# Patient Record
Sex: Female | Born: 2000
Health system: Southern US, Community
[De-identification: ages and names within clinical notes are randomized; demographics above are authoritative.]

## PROBLEM LIST (undated history)

## (undated) DIAGNOSIS — S060XAA Concussion with loss of consciousness status unknown, initial encounter: Secondary | ICD-10-CM

## (undated) DIAGNOSIS — E669 Obesity, unspecified: Secondary | ICD-10-CM

## (undated) DIAGNOSIS — S060X9A Concussion with loss of consciousness of unspecified duration, initial encounter: Secondary | ICD-10-CM

---
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
## 2019-01-12 DIAGNOSIS — R109 Unspecified abdominal pain: Secondary | ICD-10-CM | POA: Diagnosis not present

## 2019-02-04 ENCOUNTER — Other Ambulatory Visit: Payer: Self-pay

## 2019-02-04 MED ORDER — DROSPIRENONE-ETHINYL ESTRADIOL 3-0.02 MG PO TABS: 1.0000 | ORAL_TABLET | Freq: Every day | ORAL | 1 refills | Status: DC

## 2019-02-04 NOTE — Telephone Encounter (Signed)
Last OV 10/08/18 Last fill 12/19/18  #84/1

## 2019-02-17 DIAGNOSIS — Z03818 Encounter for observation for suspected exposure to other biological agents ruled out: Secondary | ICD-10-CM | POA: Diagnosis not present

## 2019-03-17 ENCOUNTER — Other Ambulatory Visit: Payer: Self-pay

## 2019-03-17 MED ORDER — SERTRALINE HCL 25 MG PO TABS: 25.0000 mg | ORAL_TABLET | Freq: Every day | ORAL | 0 refills | Status: DC

## 2019-03-17 NOTE — Telephone Encounter (Signed)
Last OV 10/08/18 Last fill 12/09/18  #90/0

## 2019-05-28 DIAGNOSIS — K011 Impacted teeth: Secondary | ICD-10-CM | POA: Diagnosis not present

## 2019-07-15 ENCOUNTER — Other Ambulatory Visit: Payer: Self-pay

## 2019-07-15 MED ORDER — DROSPIRENONE-ETHINYL ESTRADIOL 3-0.02 MG PO TABS: ORAL_TABLET | ORAL | 0 refills | Status: DC

## 2019-07-29 ENCOUNTER — Other Ambulatory Visit: Payer: Self-pay

## 2019-07-29 MED ORDER — SERTRALINE HCL 25 MG PO TABS: ORAL_TABLET | ORAL | 0 refills | Status: DC

## 2019-08-14 DIAGNOSIS — Z30012 Encounter for prescription of emergency contraception: Secondary | ICD-10-CM | POA: Diagnosis not present

## 2019-08-15 DIAGNOSIS — J020 Streptococcal pharyngitis: Secondary | ICD-10-CM | POA: Diagnosis not present

## 2019-10-20 ENCOUNTER — Other Ambulatory Visit: Payer: Self-pay | Admitting: Family Medicine

## 2019-10-22 NOTE — Telephone Encounter (Signed)
Medication refill request  Request declined. Patient must establish with a new PCP for future refills per last refill note.

## 2019-11-10 ENCOUNTER — Other Ambulatory Visit: Payer: Self-pay | Admitting: Family Medicine

## 2019-11-13 ENCOUNTER — Encounter: Payer: Self-pay | Admitting: Family Medicine

## 2019-11-13 ENCOUNTER — Telehealth (INDEPENDENT_AMBULATORY_CARE_PROVIDER_SITE_OTHER): Payer: Federal, State, Local not specified - PPO | Admitting: Family Medicine

## 2019-11-13 VITALS — Ht 70.0 in | Wt 220.0 lb

## 2019-11-13 DIAGNOSIS — Z3041 Encounter for surveillance of contraceptive pills: Secondary | ICD-10-CM

## 2019-11-13 MED ORDER — NORGESTIM-ETH ESTRAD TRIPHASIC 0.18/0.215/0.25 MG-25 MCG PO TABS: 1.0000 | ORAL_TABLET | Freq: Every day | ORAL | 3 refills | Status: DC

## 2019-11-13 NOTE — Progress Notes (Signed)
Virtual Visit via Video Note  I connected with Wanda Jarvis on 11/13/19 at 10:00 AM EDT by a video enabled telemedicine application and verified that I am speaking with the correct person using two identifiers. Location patient: home Location provider: work  Persons participating in the virtual visit: patient, provider  I discussed the limitations of evaluation and management by telemedicine and the availability of in person appointments. The patient expressed understanding and agreed to proceed.  Chief Complaint  Patient presents with  . Follow-up    BC refill     HPI: Wanda Jarvis is a 19 y.o. female who is seen today to discuss OCP. She has been on this x 5 years. She has been off of it x 1 week and feels like she has less mood swings. She would like to change to a different OCP.  Denies breakthrough bleeding. Denies vaginal itching, odor, discharge.    Past Medical History:  Diagnosis Date  . Concussion   . Obesity     Past Surgical History:  Procedure Laterality Date  . CHOLECYSTECTOMY  11/09/2015  . CHOLECYSTECTOMY N/A 11/09/2015   Procedure: LAPAROSCOPIC CHOLECYSTECTOMY;  Surgeon: Leonia Corona, MD;  Location: [facility] OR;  Service: Pediatrics;  Laterality: N/A;    Family History  Problem Relation Age of Onset  . Hypertension Maternal Grandmother   . Hyperlipidemia Maternal Grandmother   . Arthritis Maternal Grandmother   . Thyroid disease Maternal Grandmother   . Hypertension Maternal Grandfather   . Diabetes Maternal Grandfather   . Cancer Maternal Grandfather   . Hyperlipidemia Maternal Grandfather   . Arthritis Maternal Grandfather   . Thyroid disease Mother   . Diabetes Paternal Grandmother   . Arthritis Paternal Grandmother   . Arthritis Paternal Grandfather   . Heart disease Paternal Grandfather   . Arthritis Maternal Aunt   . Arthritis Maternal Uncle   . Arthritis Paternal Aunt   . Mental retardation Paternal Aunt   . Arthritis Paternal Uncle    . Deafness Other     Social History   Tobacco Use  . Smoking status: Passive Smoke Exposure - Never Smoker  . Smokeless tobacco: Never Used  . Tobacco comment: Mom smokes outside of home and car  Substance Use Topics  . Alcohol use: No  . Drug use: No     Current Outpatient Medications:  .  cetirizine (ZYRTEC) 10 MG tablet, Take 10 mg by mouth daily., Disp: , Rfl:  .  clindamycin (CLEOCIN T) 1 % external solution, APPLY TO AFFECTED AREA TWICE A DAY, Disp: 60 mL, Rfl: 0 .  drospirenone-ethinyl estradiol (NIKKI) 3-0.02 MG tablet, Take 1qd (Plz sched appt with new provider for future fills), Disp: 84 tablet, Rfl: 0 .  sertraline (ZOLOFT) 25 MG tablet, Take 1qd (Plz sched appt with new provider for future fills), Disp: 90 tablet, Rfl: 0  Allergies  Allergen Reactions  . No Known Allergies       ROS: See pertinent positives and negatives per HPI.   EXAM:  VITALS per patient if applicable: Ht 5\' 10"  (1.778 m)   Wt 220 lb (99.8 kg) Comment: pt reported  BMI 31.57 kg/m    GENERAL: alert, oriented, appears well and in no acute distress  HEENT: atraumatic, conjunctiva clear, no obvious abnormalities on inspection of external nose and ears  NECK: normal movements of the head and neck  LUNGS: on inspection no signs of respiratory distress, breathing rate appears normal, no obvious gross SOB, gasping or wheezing, no  conversational dyspnea  PSYCH/NEURO: pleasant and cooperative, no obvious depression or anxiety, speech and thought processing grossly intact   ASSESSMENT AND PLAN:  1. Encounter for birth control pills maintenance - d/c drospirenone-ethinyl estradiol Rx: - Norgestimate-Ethinyl Estradiol Triphasic 0.18/0.215/0.25 MG-25 MCG tab; Take 1 tablet by mouth daily.  Dispense: 84 tablet; Refill: 3 - f/u in 1 year for PAP and refill or sooner PRN Discussed plan and reviewed medications with patient, including risks, benefits, and potential side effects. Pt expressed  understand. All questions answered.    I discussed the assessment and treatment plan with the patient. The patient was provided an opportunity to ask questions and all were answered. The patient agreed with the plan and demonstrated an understanding of the instructions.   The patient was advised to call back or seek an in-person evaluation if the symptoms worsen or if the condition fails to improve as anticipated.   Wanda Shu, DO

## 2019-11-17 ENCOUNTER — Telehealth: Payer: Self-pay | Admitting: Family Medicine

## 2019-11-17 NOTE — Telephone Encounter (Signed)
Dr. Salena Saner please advise.  Pt was recently prescribed Norgestimate-Ethinyl Estradiol an hasn't started yet because she read bad reviews and would like to take something else. Pt knows you work Wednesday, Thursday and Friday and preferred you not doc of day.

## 2019-11-17 NOTE — Telephone Encounter (Signed)
Patient states Dr. Barron Alvine recently prescribed birth control pills, but upon reading reviews which talked about anxiety, depression and mood swings for side effects, she does not believe this medication is the right one for her. She has not started taking this medication yet and would like Dr. Barron Alvine to prescribe different birth control pills. Patient is aware Dr. Salena Saner. Is out of the office until Wednesday.

## 2019-11-19 DIAGNOSIS — F4322 Adjustment disorder with anxiety: Secondary | ICD-10-CM | POA: Diagnosis not present

## 2019-11-19 MED ORDER — LEVONORGEST-ETH ESTRAD 91-DAY 0.15-0.03 MG PO TABS: 1.0000 | ORAL_TABLET | Freq: Every day | ORAL | 4 refills | Status: DC

## 2019-11-19 NOTE — Telephone Encounter (Signed)
Dr. Salena Saner please advise.  Pt's mother was notified and she said that she would try the Seasonale out if we could [facility] for her daughter.

## 2019-11-19 NOTE — Telephone Encounter (Signed)
Pt is aware and will pick up Rx.

## 2019-11-19 NOTE — Telephone Encounter (Signed)
The possible side effects pt notes are possible with any hormonal OCP. We can try something like seasonale where pt would only get period every 3 mo (4x/year) as this has different hormonal components?

## 2019-11-19 NOTE — Telephone Encounter (Signed)
[facility] [place] [facility] previous [facility]CP Rx was sent

## 2019-11-25 DIAGNOSIS — M25562 Pain in left knee: Secondary | ICD-10-CM | POA: Diagnosis not present

## 2019-11-25 DIAGNOSIS — M79632 Pain in left forearm: Secondary | ICD-10-CM | POA: Diagnosis not present

## 2019-11-25 DIAGNOSIS — M545 Low back pain: Secondary | ICD-10-CM | POA: Diagnosis not present

## 2019-12-04 DIAGNOSIS — F4322 Adjustment disorder with anxiety: Secondary | ICD-10-CM | POA: Diagnosis not present

## 2019-12-25 DIAGNOSIS — F4322 Adjustment disorder with anxiety: Secondary | ICD-10-CM | POA: Diagnosis not present

## 2020-01-16 DIAGNOSIS — L089 Local infection of the skin and subcutaneous tissue, unspecified: Secondary | ICD-10-CM | POA: Diagnosis not present

## 2020-01-26 DIAGNOSIS — Z6830 Body mass index (BMI) 30.0-30.9, adult: Secondary | ICD-10-CM | POA: Diagnosis not present

## 2020-01-26 DIAGNOSIS — L723 Sebaceous cyst: Secondary | ICD-10-CM | POA: Diagnosis not present

## 2020-02-12 DIAGNOSIS — Z6833 Body mass index (BMI) 33.0-33.9, adult: Secondary | ICD-10-CM | POA: Diagnosis not present

## 2020-02-12 DIAGNOSIS — F330 Major depressive disorder, recurrent, mild: Secondary | ICD-10-CM | POA: Diagnosis not present

## 2020-02-12 DIAGNOSIS — F419 Anxiety disorder, unspecified: Secondary | ICD-10-CM | POA: Diagnosis not present

## 2020-02-12 DIAGNOSIS — K219 Gastro-esophageal reflux disease without esophagitis: Secondary | ICD-10-CM | POA: Diagnosis not present

## 2020-03-11 DIAGNOSIS — Z6832 Body mass index (BMI) 32.0-32.9, adult: Secondary | ICD-10-CM | POA: Diagnosis not present

## 2020-03-11 DIAGNOSIS — F419 Anxiety disorder, unspecified: Secondary | ICD-10-CM | POA: Diagnosis not present

## 2020-04-21 DIAGNOSIS — R202 Paresthesia of skin: Secondary | ICD-10-CM | POA: Diagnosis not present

## 2020-04-21 DIAGNOSIS — M25572 Pain in left ankle and joints of left foot: Secondary | ICD-10-CM | POA: Diagnosis not present

## 2020-04-21 DIAGNOSIS — M79672 Pain in left foot: Secondary | ICD-10-CM | POA: Diagnosis not present

## 2020-04-21 DIAGNOSIS — Z6832 Body mass index (BMI) 32.0-32.9, adult: Secondary | ICD-10-CM | POA: Diagnosis not present

## 2020-04-28 DIAGNOSIS — F419 Anxiety disorder, unspecified: Secondary | ICD-10-CM | POA: Diagnosis not present

## 2020-04-28 DIAGNOSIS — Z6834 Body mass index (BMI) 34.0-34.9, adult: Secondary | ICD-10-CM | POA: Diagnosis not present

## 2020-04-28 DIAGNOSIS — F330 Major depressive disorder, recurrent, mild: Secondary | ICD-10-CM | POA: Diagnosis not present

## 2020-05-02 ENCOUNTER — Other Ambulatory Visit: Payer: Self-pay | Admitting: Family Medicine

## 2020-05-03 NOTE — Telephone Encounter (Signed)
Name from pharmacy: SERTRALINE HCL 25 MG TABLET        Will file in chart as: sertraline (ZOLOFT) 25 MG tablet   Sig: TAKE 1 TABLET EVERY DAY ( SCHEDULE APPT WITH NEW PROVIDER FOR FUTURE FILLS)   Last fill 07/29/19  #90/0 Last VV 11/13/19

## 2020-05-05 DIAGNOSIS — M25572 Pain in left ankle and joints of left foot: Secondary | ICD-10-CM | POA: Diagnosis not present

## 2020-06-01 DIAGNOSIS — R61 Generalized hyperhidrosis: Secondary | ICD-10-CM | POA: Diagnosis not present

## 2020-06-01 DIAGNOSIS — Z681 Body mass index (BMI) 19 or less, adult: Secondary | ICD-10-CM | POA: Diagnosis not present

## 2020-06-01 DIAGNOSIS — F330 Major depressive disorder, recurrent, mild: Secondary | ICD-10-CM | POA: Diagnosis not present

## 2020-06-01 DIAGNOSIS — F419 Anxiety disorder, unspecified: Secondary | ICD-10-CM | POA: Diagnosis not present

## 2020-06-14 ENCOUNTER — Other Ambulatory Visit: Payer: Self-pay | Admitting: Family Medicine

## 2020-07-08 ENCOUNTER — Other Ambulatory Visit: Payer: Self-pay | Admitting: Family Medicine

## 2020-07-22 ENCOUNTER — Other Ambulatory Visit: Payer: Self-pay | Admitting: Family Medicine

## 2020-08-09 DIAGNOSIS — R350 Frequency of micturition: Secondary | ICD-10-CM | POA: Diagnosis not present

## 2020-08-09 DIAGNOSIS — N3000 Acute cystitis without hematuria: Secondary | ICD-10-CM | POA: Diagnosis not present

## 2020-08-09 DIAGNOSIS — R300 Dysuria: Secondary | ICD-10-CM | POA: Diagnosis not present

## 2020-08-18 DIAGNOSIS — R300 Dysuria: Secondary | ICD-10-CM | POA: Diagnosis not present

## 2020-08-18 DIAGNOSIS — R350 Frequency of micturition: Secondary | ICD-10-CM | POA: Diagnosis not present

## 2020-08-18 DIAGNOSIS — Z6834 Body mass index (BMI) 34.0-34.9, adult: Secondary | ICD-10-CM | POA: Diagnosis not present

## 2020-08-19 DIAGNOSIS — B9689 Other specified bacterial agents as the cause of diseases classified elsewhere: Secondary | ICD-10-CM | POA: Diagnosis not present

## 2020-08-19 DIAGNOSIS — R109 Unspecified abdominal pain: Secondary | ICD-10-CM | POA: Diagnosis not present

## 2020-08-19 DIAGNOSIS — Z9049 Acquired absence of other specified parts of digestive tract: Secondary | ICD-10-CM | POA: Diagnosis not present

## 2020-08-19 DIAGNOSIS — N390 Urinary tract infection, site not specified: Secondary | ICD-10-CM | POA: Diagnosis not present

## 2020-08-19 DIAGNOSIS — R350 Frequency of micturition: Secondary | ICD-10-CM | POA: Diagnosis not present

## 2020-08-19 DIAGNOSIS — F17200 Nicotine dependence, unspecified, uncomplicated: Secondary | ICD-10-CM | POA: Diagnosis not present

## 2020-08-19 DIAGNOSIS — K760 Fatty (change of) liver, not elsewhere classified: Secondary | ICD-10-CM | POA: Diagnosis not present

## 2020-08-19 DIAGNOSIS — Z87440 Personal history of urinary (tract) infections: Secondary | ICD-10-CM | POA: Diagnosis not present

## 2020-11-12 DIAGNOSIS — F411 Generalized anxiety disorder: Secondary | ICD-10-CM | POA: Diagnosis not present

## 2020-11-15 DIAGNOSIS — F411 Generalized anxiety disorder: Secondary | ICD-10-CM | POA: Diagnosis not present

## 2020-11-24 DIAGNOSIS — F411 Generalized anxiety disorder: Secondary | ICD-10-CM | POA: Diagnosis not present

## 2020-12-01 DIAGNOSIS — F411 Generalized anxiety disorder: Secondary | ICD-10-CM | POA: Diagnosis not present

## 2020-12-22 DIAGNOSIS — F411 Generalized anxiety disorder: Secondary | ICD-10-CM | POA: Diagnosis not present

## 2021-01-06 DIAGNOSIS — F411 Generalized anxiety disorder: Secondary | ICD-10-CM | POA: Diagnosis not present

## 2021-01-08 ENCOUNTER — Telehealth: Payer: Self-pay | Admitting: Family Medicine

## 2021-01-08 MED ORDER — LEVONORGEST-ETH ESTRAD 91-DAY 0.15-0.03 MG PO TABS: 1.0000 | ORAL_TABLET | Freq: Every day | ORAL | 0 refills | Status: DC

## 2021-01-08 NOTE — Telephone Encounter (Addendum)
Received message from mom that patient needs OCP refill. Last saw Dr Barron Alvine 10/2019.  Will send in 3 month supply, pt to schedule transfer care OV with new provider in interim, or f/u with [facility] for more refills.   Will send to front office to assist in scheduling transfer of care appointment, may do at [facility] or with Tabitha at [facility], pt preference. Schedule in office if able, otherwise could do virtual visit if she's staying in [place].

## 2021-01-10 NOTE — Telephone Encounter (Signed)
Lvm for pt to get scheduled with TOC with our news proviver Wyatt Mage

## 2021-01-11 NOTE — Telephone Encounter (Signed)
Pt scheduled a TOC with Tabitha on 12.1.22

## 2021-01-13 DIAGNOSIS — F411 Generalized anxiety disorder: Secondary | ICD-10-CM | POA: Diagnosis not present

## 2021-01-27 ENCOUNTER — Ambulatory Visit: Payer: Federal, State, Local not specified - PPO | Admitting: Family

## 2021-01-27 DIAGNOSIS — F411 Generalized anxiety disorder: Secondary | ICD-10-CM | POA: Diagnosis not present

## 2021-02-17 ENCOUNTER — Ambulatory Visit: Payer: Federal, State, Local not specified - PPO | Admitting: Family

## 2021-02-24 ENCOUNTER — Encounter: Payer: Self-pay | Admitting: Family

## 2021-02-24 ENCOUNTER — Telehealth (INDEPENDENT_AMBULATORY_CARE_PROVIDER_SITE_OTHER): Payer: Federal, State, Local not specified - PPO | Admitting: Family

## 2021-02-24 ENCOUNTER — Other Ambulatory Visit: Payer: Self-pay

## 2021-02-24 VITALS — Ht 70.0 in | Wt 240.0 lb

## 2021-02-24 DIAGNOSIS — E049 Nontoxic goiter, unspecified: Secondary | ICD-10-CM

## 2021-02-24 DIAGNOSIS — R635 Abnormal weight gain: Secondary | ICD-10-CM | POA: Diagnosis not present

## 2021-02-24 DIAGNOSIS — F419 Anxiety disorder, unspecified: Secondary | ICD-10-CM | POA: Diagnosis not present

## 2021-02-24 DIAGNOSIS — R61 Generalized hyperhidrosis: Secondary | ICD-10-CM

## 2021-02-24 DIAGNOSIS — Z3040 Encounter for surveillance of contraceptives, unspecified: Secondary | ICD-10-CM | POA: Insufficient documentation

## 2021-02-24 DIAGNOSIS — Z9109 Other allergy status, other than to drugs and biological substances: Secondary | ICD-10-CM

## 2021-02-24 DIAGNOSIS — N920 Excessive and frequent menstruation with regular cycle: Secondary | ICD-10-CM

## 2021-02-24 DIAGNOSIS — F32A Depression, unspecified: Secondary | ICD-10-CM

## 2021-02-24 MED ORDER — ESCITALOPRAM OXALATE 10 MG PO TABS: 10.0000 mg | ORAL_TABLET | Freq: Every day | ORAL | 2 refills | Status: DC

## 2021-02-24 MED ORDER — LEVONORGEST-ETH ESTRAD 91-DAY 0.15-0.03 MG PO TABS: 1.0000 | ORAL_TABLET | Freq: Every day | ORAL | 0 refills | Status: DC

## 2021-02-24 NOTE — Assessment & Plan Note (Signed)
lexapro 10 mg refill sent to pharmacy. discussed rare but serious side effect of suicidal ideation.  She is instructed to discontinue medication and go directly to ED if this occurs.  Pt verbalizes understanding.  Plan is to follow up every six months to monitor. phq9 and gad7 reviewed.

## 2021-02-24 NOTE — Assessment & Plan Note (Signed)
Will assess for TSH, pending results. Work on diet and exercises.

## 2021-02-24 NOTE — Assessment & Plan Note (Signed)
Controlled at present per pt.

## 2021-02-24 NOTE — Assessment & Plan Note (Signed)
tsh ordered, pending results.

## 2021-02-24 NOTE — Assessment & Plan Note (Signed)
Work on diet and exercise. Weight gain workup TSH ordered, pending results.

## 2021-02-24 NOTE — Assessment & Plan Note (Addendum)
Stable, controlled with birth control. Sent refill for OCPs

## 2021-02-24 NOTE — Progress Notes (Signed)
MyChart Video Visit    Virtual Visit via Video Note   This visit type was conducted due to national recommendations for restrictions regarding the COVID-19 Pandemic (e.g. social distancing) in an effort to limit this patient's exposure and mitigate transmission in our community. This patient is at least at moderate risk for complications without adequate follow up. This format is felt to be most appropriate for this patient at this time. Physical exam was limited by quality of the video and audio technology used for the visit. CMA was able to get the patient set up on a video visit.  Patient location: Home. Patient and provider in visit Provider location: Office  I discussed the limitations of evaluation and management by telemedicine and the availability of in person appointments. The patient expressed understanding and agreed to proceed.  Visit Date: 02/24/2021  Today's healthcare provider: Mort Sawyers, FNP   Subjective:  Patient ID: Wanda Jarvis, female    DOB: Feb 12, 2001  Age: 20 y.o. MRN: [id number]  CC:  Chief Complaint  Patient presents with   Transitions Of Care    HPI Wanda Jarvis is here today for a transition of care visit as well as for refills on her OCP as well as lexapro.  Patient was previously seeing Dr. Clifton Custard.  Pt is without acute concerns.   Pt has been on birth control (seasonale), pt does well without side effects with this. She did run out of birth control. She still is taking tablets, but she is about to run out. LMP 02/15/21, which was out of the ordinary but she did get a bit off cycle bc she had been running out and had skipped a few days. Had sex over a month ago, took a pregnancy test, and was negative. Has had a period since. Uses condoms.   In the last six months has had two different sexual female partners.   Anxiety/depression: mainly anxiety, but lexapro 10 mg is doing really well on. Had tried zoloft prior but it didn't seem to 'work  with her body'. She had been on prozac as well, but didn't like it at all either. No SI or HI.   PHQ9 SCORE ONLY 02/24/2021 02/24/2021 11/13/2019  PHQ-9 Total Score 2 0 0   GAD 7 : Generalized Anxiety Score 02/24/2021 10/08/2018 09/19/2018  Nervous, Anxious, on Edge 0 0 0  Control/stop worrying 2 1 0  Worry too much - different things 1 1 0  Trouble relaxing 1 0 0  Restless 0 0 0  Easily annoyed or irritable 1 0 1  Afraid - awful might happen 0 0 0  Total GAD 7 Score Anxiety Difficulty - Not difficult at all Not difficult at all      Chronic problems addressed today:  HTN: from readings prior, htn is not an accurate dx and has resolved if it was at one time.   Hyperhydrosis: worse in high school , now more tolerable.   Wt Readings from Last 3 Encounters:  02/24/21 240 lb (108.9 kg)  11/13/19 220 lb (99.8 kg)  10/08/18 205 lb (93 kg) (98 %, Z= 2.02)*   * Growth percentiles are based on CDC (Girls, 2-20 Years) data.   Temp Readings from Last 3 Encounters:  12/10/17 98.8 F (37.1 C) (Oral)  07/24/17 98.1 F (36.7 C) (Oral)  07/19/17 98.2 F (36.8 C) (Oral)   BP Readings from Last 3 Encounters:  12/10/17 104/68  10/10/17 110/80  08/22/17 110/68 (40 %,  Z = -0.25 /  55 %, Z = 0.13)*   *BP percentiles are based on the 2017 AAP Clinical Practice Guideline for girls   Pulse Readings from Last 3 Encounters:  12/10/17 72  10/10/17 75  08/22/17 64     Past Medical History:  Diagnosis Date   Concussion    Hypermobility syndrome 05/18/2017   Obesity     Past Surgical History:  Procedure Laterality Date   CHOLECYSTECTOMY  11/09/2015   CHOLECYSTECTOMY N/A 11/09/2015   Procedure: LAPAROSCOPIC CHOLECYSTECTOMY;  Surgeon: Leonia Corona, MD;  Location: [facility] OR;  Service: Pediatrics;  Laterality: N/A;    Family History  Problem Relation Age of Onset   Hypertension Maternal Grandmother    Hyperlipidemia Maternal Grandmother    Arthritis Maternal Grandmother     Thyroid disease Maternal Grandmother    Hypertension Maternal Grandfather    Diabetes Maternal Grandfather    Cancer Maternal Grandfather    Hyperlipidemia Maternal Grandfather    Arthritis Maternal Grandfather    Thyroid disease Mother    Diabetes Paternal Grandmother    Arthritis Paternal Grandmother    Arthritis Paternal Grandfather    Heart disease Paternal Grandfather    Arthritis Maternal Aunt    Arthritis Maternal Uncle    Arthritis Paternal Aunt    Mental retardation Paternal Aunt    Arthritis Paternal Uncle    Deafness Other     Social History   Socioeconomic History   Marital status: Single    Spouse name: Not on file   Number of children: Not on file   Years of education: Not on file   Highest education level: Not on file  Occupational History   Occupation: student    Comment: [employer]  Tobacco Use   Smoking status: Never    Passive exposure: Yes   Smokeless tobacco: Never   Tobacco comments:    Mom smokes outside of home and car  Vaping Use   Vaping Use: Every day   Start date: 01/11/2017   Substances: Nicotine  Substance and Sexual Activity   Alcohol use: Yes    Alcohol/week: 6.0 standard drinks    Types: 6 Standard drinks or equivalent per week    Comment: a few times a month   Drug use: No   Sexual activity: Yes    Partners: Male    Birth control/protection: Condom, OCP  Other Topics Concern   Not on file  Social History Narrative   Only child, 1 dog in the home      Parents Healthy      Parents married      Mom works at [employer]       Dad is supvr @ [employer] (consulting)      Attends [employer] 11th grade.   Social Determinants of Health   Financial Resource Strain: Not on file  Food Insecurity: Not on file  Transportation Needs: Not on file  Physical Activity: Not on file  Stress: Not on file  Social Connections: Not on file  Intimate Partner Violence: Not on file    Outpatient Medications Prior to Visit  Medication Sig  Dispense Refill   cetirizine (ZYRTEC) 10 MG tablet Take 10 mg by mouth daily.     clindamycin (CLEOCIN T) 1 % external solution APPLY TO AFFECTED AREA TWICE A DAY 60 mL 0   escitalopram (LEXAPRO) 10 MG tablet Take 1 tablet by mouth daily.     levonorgestrel-ethinyl estradiol (SEASONALE) 0.15-0.03 MG tablet Take 1 tablet by  mouth daily. 91 tablet 0   sertraline (ZOLOFT) 25 MG tablet TAKE 1 TABLET EVERY DAY ( SCHEDULE APPT WITH NEW PROVIDER FOR FUTURE FILLS) 30 tablet 1   No facility-administered medications prior to visit.    No Known Allergies  ROS Review of Systems  Constitutional:  Positive for unexpected weight change (some weight gain, however thinks due to diet).   Review of Systems  Respiratory:  Negative for shortness of breath.   Cardiovascular:  Negative for chest pain and palpitations.  Gastrointestinal:  Negative for constipation and diarrhea.  Genitourinary:  Negative for dysuria, frequency and urgency.  Musculoskeletal:  Negative for myalgias.  Psychiatric/Behavioral:  Negative for depression and suicidal ideas.   All other systems reviewed and are negative.    Objective:    Physical Exam Constitutional:      General: She is not in acute distress.    Appearance: Normal appearance. She is obese. She is not ill-appearing, toxic-appearing or diaphoretic.  HENT:     Head: Normocephalic.  Pulmonary:     Effort: Pulmonary effort is normal.  Neurological:     General: No focal deficit present.     Mental Status: She is alert and oriented to person, place, and time.  Psychiatric:        Mood and Affect: Mood normal.        Behavior: Behavior normal.        Thought Content: Thought content normal.        Judgment: Judgment normal.      Ht 5\' 10"  (1.778 m)    Wt 240 lb (108.9 kg)    LMP 02/14/2021 (Exact Date)    BMI 34.44 kg/m  Wt Readings from Last 3 Encounters:  02/24/21 240 lb (108.9 kg)  11/13/19 220 lb (99.8 kg)  10/08/18 205 lb (93 kg) (98 %, Z= 2.02)*    * Growth percentiles are based on CDC (Girls, 2-20 Years) data.     Health Maintenance Due  Topic Date Due   PAP-Cervical Cytology Screening  Never done   TETANUS/TDAP  10/06/2020    There are no preventive care reminders to display for this patient.  Lab Results  Component Value Date   TSH 1.33 07/21/2016   Lab Results  Component Value Date   WBC 5.4 11/09/2015   HGB 15.3 11/09/2015   HCT 44.3 11/09/2015   MCV 89.1 11/09/2015   PLT 200 11/09/2015   Lab Results  Component Value Date   NA 137 11/06/2012   K 4.1 11/06/2012   CO2 27 11/06/2012   GLUCOSE 107 (H) 11/06/2012   BUN 12 11/06/2012   CREATININE 0.60 01/24/2013   BILITOT 0.7 11/06/2012   ALKPHOS 131 (H) 11/06/2012   AST 19 11/06/2012   ALT 8 11/06/2012   PROT 7.5 11/06/2012   ALBUMIN 4.1 11/06/2012   CALCIUM 9.5 11/06/2012   GFR 147.57 11/06/2012   No results found for: CHOL No results found for: HDL No results found for: LDLCALC No results found for: TRIG No results found for: CHOLHDL Lab Results  Component Value Date   HGBA1C 5.0 07/28/2015      Assessment & Plan:   Problem List Items Addressed This Visit       Endocrine   Goiter    tsh ordered, pending results.      Relevant Orders   TSH     Musculoskeletal and Integument   Hyperhidrosis    Controlled at present per pt.  Other   Environmental allergies    Continue with zyrtec as needed.      Morbid obesity (HCC)    Work on diet and exercise. Weight gain workup TSH ordered, pending results.      Menorrhagia - Primary    Stable, controlled with birth control. Sent refill for OCPs      Relevant Medications   escitalopram (LEXAPRO) 10 MG tablet   Anxiety and depression    lexapro 10 mg refill sent to pharmacy. discussed rare but serious side effect of suicidal ideation.  She is instructed to discontinue medication and go directly to ED if this occurs.  Pt verbalizes understanding.  Plan is to follow up every six  months to monitor. phq9 and gad7 reviewed.       Relevant Medications   escitalopram (LEXAPRO) 10 MG tablet   Weight gain    Will assess for TSH, pending results. Work on diet and exercises.       Relevant Orders   TSH   Encounter for refill of prescription for contraception    hcg ordered, pending results. Refill sent to pharmacy for OCPs. Counseled on safe sex. 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    Dispense:  90 tablet    Refill:  2    Order Specific Question:   Supervising Provider    Answer:   BEDSOLE, AMY E [[id number]]   levonorgestrel-ethinyl estradiol (SEASONALE) 0.15-0.03 MG tablet    Sig: Take 1 tablet by mouth daily.    Dispense:  91 tablet    Refill:  0    Order Specific Question:   Supervising Provider    Answer:   Ermalene Searing, AMY E [[id number]]    Follow-up: Return in about 6 months (around 08/25/2021) for PAP well woman visit.   I provided 20 minutes of face-to-face time during this encounter.  Mort Sawyers, FNP

## 2021-02-24 NOTE — Assessment & Plan Note (Signed)
hcg ordered, pending results. Refill sent to pharmacy for OCPs. Counseled on safe sex. 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

## 2021-02-24 NOTE — Assessment & Plan Note (Signed)
Continue with zyrtec as needed.

## 2021-05-26 DIAGNOSIS — R350 Frequency of micturition: Secondary | ICD-10-CM | POA: Diagnosis not present

## 2021-05-26 DIAGNOSIS — N3001 Acute cystitis with hematuria: Secondary | ICD-10-CM | POA: Diagnosis not present

## 2021-05-26 DIAGNOSIS — Z708 Other sex counseling: Secondary | ICD-10-CM | POA: Diagnosis not present

## 2021-07-18 ENCOUNTER — Other Ambulatory Visit: Payer: Self-pay | Admitting: Family

## 2021-07-18 DIAGNOSIS — Z3040 Encounter for surveillance of contraceptives, unspecified: Secondary | ICD-10-CM

## 2021-10-15 ENCOUNTER — Other Ambulatory Visit: Payer: Self-pay | Admitting: Family

## 2021-10-15 DIAGNOSIS — Z3040 Encounter for surveillance of contraceptives, unspecified: Secondary | ICD-10-CM

## 2021-10-26 DIAGNOSIS — Z681 Body mass index (BMI) 19 or less, adult: Secondary | ICD-10-CM | POA: Diagnosis not present

## 2021-10-26 DIAGNOSIS — J038 Acute tonsillitis due to other specified organisms: Secondary | ICD-10-CM | POA: Diagnosis not present

## 2021-10-26 DIAGNOSIS — B9689 Other specified bacterial agents as the cause of diseases classified elsewhere: Secondary | ICD-10-CM | POA: Diagnosis not present

## 2021-10-26 DIAGNOSIS — J029 Acute pharyngitis, unspecified: Secondary | ICD-10-CM | POA: Diagnosis not present

## 2021-12-22 DIAGNOSIS — J029 Acute pharyngitis, unspecified: Secondary | ICD-10-CM | POA: Diagnosis not present

## 2021-12-22 DIAGNOSIS — J039 Acute tonsillitis, unspecified: Secondary | ICD-10-CM | POA: Diagnosis not present

## 2021-12-27 DIAGNOSIS — F330 Major depressive disorder, recurrent, mild: Secondary | ICD-10-CM | POA: Diagnosis not present

## 2021-12-27 DIAGNOSIS — Z113 Encounter for screening for infections with a predominantly sexual mode of transmission: Secondary | ICD-10-CM | POA: Diagnosis not present

## 2021-12-27 DIAGNOSIS — Z118 Encounter for screening for other infectious and parasitic diseases: Secondary | ICD-10-CM | POA: Diagnosis not present

## 2021-12-27 DIAGNOSIS — R5381 Other malaise: Secondary | ICD-10-CM | POA: Diagnosis not present

## 2021-12-27 DIAGNOSIS — R5383 Other fatigue: Secondary | ICD-10-CM | POA: Diagnosis not present

## 2021-12-27 DIAGNOSIS — F419 Anxiety disorder, unspecified: Secondary | ICD-10-CM | POA: Diagnosis not present

## 2022-01-05 DIAGNOSIS — R5381 Other malaise: Secondary | ICD-10-CM | POA: Diagnosis not present

## 2022-01-05 DIAGNOSIS — R768 Other specified abnormal immunological findings in serum: Secondary | ICD-10-CM | POA: Diagnosis not present

## 2022-01-05 DIAGNOSIS — B009 Herpesviral infection, unspecified: Secondary | ICD-10-CM | POA: Diagnosis not present

## 2022-01-10 DIAGNOSIS — R5381 Other malaise: Secondary | ICD-10-CM | POA: Diagnosis not present

## 2022-01-13 ENCOUNTER — Other Ambulatory Visit: Payer: Self-pay | Admitting: Family

## 2022-01-13 DIAGNOSIS — Z3040 Encounter for surveillance of contraceptives, unspecified: Secondary | ICD-10-CM

## 2022-01-18 ENCOUNTER — Telehealth: Payer: Self-pay | Admitting: Family

## 2022-01-18 DIAGNOSIS — Z3040 Encounter for surveillance of contraceptives, unspecified: Secondary | ICD-10-CM

## 2022-01-18 NOTE — Telephone Encounter (Signed)
Pt mother called in stated pt needs a refill of birth control medication . Did not know the name of Medication stated pt is away at school and could not make a appointment with PCP until Dec. Please advise # [phone]

## 2022-01-22 MED ORDER — LEVONORGEST-ETH ESTRAD 91-DAY 0.15-0.03 MG PO TABS: 1.0000 | ORAL_TABLET | Freq: Every day | ORAL | 0 refills | Status: DC

## 2022-03-08 DIAGNOSIS — Z03818 Encounter for observation for suspected exposure to other biological agents ruled out: Secondary | ICD-10-CM | POA: Diagnosis not present

## 2022-03-08 DIAGNOSIS — J111 Influenza due to unidentified influenza virus with other respiratory manifestations: Secondary | ICD-10-CM | POA: Diagnosis not present

## 2022-03-08 DIAGNOSIS — Z20822 Contact with and (suspected) exposure to covid-19: Secondary | ICD-10-CM | POA: Diagnosis not present

## 2022-03-08 DIAGNOSIS — M791 Myalgia, unspecified site: Secondary | ICD-10-CM | POA: Diagnosis not present

## 2022-03-08 DIAGNOSIS — R509 Fever, unspecified: Secondary | ICD-10-CM | POA: Diagnosis not present

## 2022-04-15 ENCOUNTER — Other Ambulatory Visit: Payer: Self-pay | Admitting: Family

## 2022-04-15 DIAGNOSIS — Z3040 Encounter for surveillance of contraceptives, unspecified: Secondary | ICD-10-CM

## 2022-04-20 ENCOUNTER — Other Ambulatory Visit: Payer: Self-pay | Admitting: Family

## 2022-04-20 DIAGNOSIS — N920 Excessive and frequent menstruation with regular cycle: Secondary | ICD-10-CM

## 2022-04-21 ENCOUNTER — Telehealth: Payer: Self-pay | Admitting: Family

## 2022-04-21 DIAGNOSIS — Z3040 Encounter for surveillance of contraceptives, unspecified: Secondary | ICD-10-CM

## 2022-04-21 MED ORDER — LEVONORGEST-ETH ESTRAD 91-DAY 0.15-0.03 MG PO TABS: 1.0000 | ORAL_TABLET | Freq: Every day | ORAL | 0 refills | Status: DC

## 2022-04-21 NOTE — Telephone Encounter (Signed)
Patient has scheduled an appt for next month. Only 30 day supply sent to cover that time.

## 2022-04-21 NOTE — Telephone Encounter (Signed)
Spoke with Mom(DPR) and advised that patient would need to schedule a CPE first before we can send in medication. We will send in one month supply after appt is scheduled.

## 2022-04-21 NOTE — Addendum Note (Signed)
Addended by: Vaughan Browner on: 04/21/2022 11:57 AM   Modules accepted: Orders

## 2022-04-21 NOTE — Telephone Encounter (Signed)
Prescription Request  04/21/2022  Is this a "Controlled Substance" medicine? No  LOV: Visit date not found  What is the name of the medication or equipment? levonorgestrel-ethinyl estradiol ([place]) 0.15-0.03 MG tablet   Have you contacted your pharmacy to request a refill? No   Which pharmacy would you like this sent to?   [id number] [place] [facility] [id number]: [phone]: [phone]   Patient notified that their request is being sent to the clinical staff for review and that they should receive a response within 2 business days.   Please advise at Mobile [phone](mobile)

## 2022-05-16 ENCOUNTER — Ambulatory Visit (INDEPENDENT_AMBULATORY_CARE_PROVIDER_SITE_OTHER): Payer: Federal, State, Local not specified - PPO | Admitting: Family

## 2022-05-16 ENCOUNTER — Encounter: Payer: Self-pay | Admitting: Family

## 2022-05-16 VITALS — BP 120/66 | HR 72 | Temp 97.9°F | Ht 70.0 in | Wt 236.4 lb

## 2022-05-16 DIAGNOSIS — F32A Depression, unspecified: Secondary | ICD-10-CM

## 2022-05-16 DIAGNOSIS — N920 Excessive and frequent menstruation with regular cycle: Secondary | ICD-10-CM | POA: Diagnosis not present

## 2022-05-16 DIAGNOSIS — Z13220 Encounter for screening for lipoid disorders: Secondary | ICD-10-CM

## 2022-05-16 DIAGNOSIS — Z3040 Encounter for surveillance of contraceptives, unspecified: Secondary | ICD-10-CM

## 2022-05-16 DIAGNOSIS — Z23 Encounter for immunization: Secondary | ICD-10-CM | POA: Diagnosis not present

## 2022-05-16 DIAGNOSIS — Z0000 Encounter for general adult medical examination without abnormal findings: Secondary | ICD-10-CM

## 2022-05-16 DIAGNOSIS — F419 Anxiety disorder, unspecified: Secondary | ICD-10-CM

## 2022-05-16 LAB — LIPID PANEL
Cholesterol: 197 mg/dL (ref 0–200)
HDL: 71.3 mg/dL (ref >39.00)
LDL Cholesterol: 102 mg/dL — ABNORMAL HIGH (ref 0–99)
NonHDL: 125.67
Total CHOL/HDL Ratio: 3
Triglycerides: 116 mg/dL (ref 0.0–149.0)
VLDL: 23.2 mg/dL (ref 0.0–40.0)

## 2022-05-16 LAB — BASIC METABOLIC PANEL
BUN: 13 mg/dL (ref 6–23)
CO2: 23 mEq/L (ref 19–32)
Calcium: 9.3 mg/dL (ref 8.4–10.5)
Chloride: 105 mEq/L (ref 96–112)
Creatinine, Ser: 0.78 mg/dL (ref 0.40–1.20)
GFR: 107.65 mL/min (ref >60.00)
Glucose, Bld: 89 mg/dL (ref 70–99)
Potassium: 4 mEq/L (ref 3.5–5.1)
Sodium: 137 mEq/L (ref 135–145)

## 2022-05-16 LAB — CBC
HCT: 40.9 % (ref 36.0–46.0)
Hemoglobin: 14 g/dL (ref 12.0–15.0)
MCHC: 34.4 g/dL (ref 30.0–36.0)
MCV: 90.8 fl (ref 78.0–100.0)
Platelets: 275 10*3/uL (ref 150.0–400.0)
RBC: 4.5 Mil/uL (ref 3.87–5.11)
RDW: 13.7 % (ref 11.5–15.5)
WBC: 5.9 10*3/uL (ref 4.0–10.5)

## 2022-05-16 LAB — HCG, QUANTITATIVE, PREGNANCY: Quantitative HCG: <0.6 m[IU]/mL

## 2022-05-16 MED ORDER — ESCITALOPRAM OXALATE 10 MG PO TABS: 10.0000 mg | ORAL_TABLET | Freq: Every day | ORAL | 3 refills | Status: DC

## 2022-05-16 MED ORDER — LEVONORGEST-ETH ESTRAD 91-DAY 0.15-0.03 MG PO TABS: 1.0000 | ORAL_TABLET | Freq: Every day | ORAL | 3 refills | Status: DC

## 2022-05-16 NOTE — Patient Instructions (Signed)
  Stop by the lab prior to leaving today. I will notify you of your results once received.   Recommendations on keeping yourself healthy:  - Exercise at least 30-45 minutes a day, 3-4 days a week.  - Eat a low-fat diet with lots of fruits and vegetables, up to 7-9 servings per day.  - Seatbelts can save your life. Wear them always.  - Smoke detectors on every level of your home, check batteries every year.  - Eye Doctor - have an eye exam every 1-2 years  - Safe sex - if you may be exposed to STDs, use a condom.  - Alcohol -  If you drink, do it moderately, less than 2 drinks per day.  - Health Care Power of Attorney. Choose someone to speak for you if you are not able.  - Depression is common in our stressful world.If you're feeling down or losing interest in things you normally enjoy, please come in for a visit.  - Violence - If anyone is threatening or hurting you, please call immediately.  Due to recent changes in healthcare laws, you may see results of your imaging and/or laboratory studies on MyChart before I have had a chance to review them.  I understand that in some cases there may be results that are confusing or concerning to you. Please understand that not all results are received at the same time and often I may need to interpret multiple results in order to provide you with the best plan of care or course of treatment. Therefore, I ask that you please give me 2 business days to thoroughly review all your results before contacting my office for clarification. Should we see a critical lab result, you will be contacted sooner.   I will see you again in one year for your annual comprehensive exam unless otherwise stated and or with acute concerns.  It was a pleasure seeing you today! Please do not hesitate to reach out with any questions and or concerns.  Regards,   Makylee Sanborn    

## 2022-05-16 NOTE — Assessment & Plan Note (Signed)
Stable with birth control

## 2022-05-16 NOTE — Assessment & Plan Note (Signed)
Stable Continue lexapro 10 mg once daily

## 2022-05-16 NOTE — Addendum Note (Signed)
Addended by: Vaughan Browner on: 05/16/2022 11:44 AM   Modules accepted: Orders

## 2022-05-16 NOTE — Assessment & Plan Note (Signed)
Patient Counseling(The following topics were reviewed): ? Preventative care handout given to pt  ?Health maintenance and immunizations reviewed. Please refer to Health maintenance section. ?Pt advised on safe sex, wearing seatbelts in car, and proper nutrition ?labwork ordered today for annual ?Dental health: Discussed importance of regular tooth brushing, flossing, and dental visits. ? ? ?

## 2022-05-16 NOTE — Progress Notes (Signed)
Subjective:  Patient ID: Wanda Jarvis, female    DOB: 12/04/2000  Age: 22 y.o. MRN: [id number]  Patient Care Team: Eugenia Pancoast, FNP as PCP - General (Family Medicine)   CC:  Chief Complaint  Patient presents with   Annual Exam    HPI Wanda Jarvis is a 22 y.o. female who presents today for an annual physical exam. She reports consuming a general diet. Home exercise routine includes walking and yoga t home. She generally feels well. She reports sleeping well. She does have additional problems to discuss today.   Vision:Not within last year Dental:No regular dental care STD:The patient denies history of sexually transmitted disease. Does have h/o genital herpes, never had an outbreak.   Last pap: never had but will schedule this.   Pt is with acute concerns.   Menses , regularly and monthly not as heavy or painful anymore since birth control.   Advanced Directives Patient does not have advanced directives including  n/a . She does not have a copy in the electronic medical record.   DEPRESSION SCREENING    05/16/2022   10:55 AM 02/24/2021    8:25 AM 02/24/2021    7:52 AM 11/13/2019    9:55 AM 10/08/2018    8:14 AM 09/19/2018    9:06 AM  PHQ 2/9 Scores  PHQ - 2 Score 0 0 0 0 0 1  PHQ- 9 Score  2   0 5     ROS: Negative unless specifically indicated above in HPI.    Current Outpatient Medications:    cetirizine (ZYRTEC) 10 MG tablet, Take 10 mg by mouth daily., Disp: , Rfl:    escitalopram (LEXAPRO) 10 MG tablet, Take 1 tablet (10 mg total) by mouth daily., Disp: 90 tablet, Rfl: 3   levonorgestrel-ethinyl estradiol (SEASONALE) 0.15-0.03 MG tablet, Take 1 tablet by mouth daily., Disp: 90 tablet, Rfl: 3    Objective:    BP 120/66   Pulse 72   Temp 97.9 F (36.6 C) (Temporal)   Ht 5\' 10"  (1.778 m)   Wt 236 lb 6.4 oz (107.2 kg)   SpO2 98%   BMI 33.92 kg/m   BP Readings from Last 3 Encounters:  05/16/22 120/66  12/10/17 104/68  10/10/17 110/80       Physical Exam Vitals reviewed.  Constitutional:      General: She is not in acute distress.    Appearance: Normal appearance. She is obese. She is not ill-appearing.  HENT:     Head: Normocephalic.     Right Ear: Tympanic membrane normal.     Left Ear: Tympanic membrane normal.     Nose: Nose normal.     Mouth/Throat:     Mouth: Mucous membranes are moist.  Eyes:     Extraocular Movements: Extraocular movements intact.     Pupils: Pupils are equal, round, and reactive to light.  Cardiovascular:     Rate and Rhythm: Normal rate and regular rhythm.  Pulmonary:     Effort: Pulmonary effort is normal.     Breath sounds: Normal breath sounds.  Abdominal:     General: Abdomen is flat. Bowel sounds are normal.     Palpations: Abdomen is soft.     Tenderness: There is no guarding or rebound.  Musculoskeletal:        General: Normal range of motion.     Cervical back: Normal range of motion.  Skin:    General: Skin is warm.  Capillary Refill: Capillary refill takes less than 2 seconds.  Neurological:     General: No focal deficit present.     Mental Status: She is alert.  Psychiatric:        Mood and Affect: Mood normal.        Behavior: Behavior normal.        Thought Content: Thought content normal.        Judgment: Judgment normal.          Assessment & Plan:  Need for Td vaccine  Menorrhagia with regular cycle Assessment & Plan: Stable with birth control   Orders: -     Escitalopram Oxalate; Take 1 tablet (10 mg total) by mouth daily.  Dispense: 90 tablet; Refill: 3 -     hCG, quantitative, pregnancy  Encounter for refill of prescription for contraception -     Levonorgest-Eth Estrad 91-Day; Take 1 tablet by mouth daily.  Dispense: 90 tablet; Refill: 3  Screening for lipoid disorders -     Lipid panel  Encounter for general adult medical examination without abnormal findings Assessment & Plan: Patient Counseling(The following topics were reviewed):   Preventative care handout given to pt  Health maintenance and immunizations reviewed. Please refer to Health maintenance section. Pt advised on safe sex, wearing seatbelts in car, and proper nutrition labwork ordered today for annual Dental health: Discussed importance of regular tooth brushing, flossing, and dental visits.   Orders: -     Lipid panel -     Basic metabolic panel -     CBC  Anxiety and depression Assessment & Plan: Stable Continue lexapro 10 mg once daily        Follow-up: Return in about 1 year (around 05/16/2023) for f/u CPE.   Eugenia Pancoast, FNP

## 2022-07-01 ENCOUNTER — Other Ambulatory Visit: Payer: Self-pay | Admitting: Family

## 2022-07-01 DIAGNOSIS — Z3040 Encounter for surveillance of contraceptives, unspecified: Secondary | ICD-10-CM

## 2023-01-14 DIAGNOSIS — J029 Acute pharyngitis, unspecified: Secondary | ICD-10-CM | POA: Diagnosis not present

## 2023-02-07 DIAGNOSIS — Z23 Encounter for immunization: Secondary | ICD-10-CM | POA: Diagnosis not present

## 2023-02-07 DIAGNOSIS — L03031 Cellulitis of right toe: Secondary | ICD-10-CM | POA: Diagnosis not present

## 2023-02-07 DIAGNOSIS — Z6833 Body mass index (BMI) 33.0-33.9, adult: Secondary | ICD-10-CM | POA: Diagnosis not present

## 2023-03-20 DIAGNOSIS — M21621 Bunionette of right foot: Secondary | ICD-10-CM | POA: Diagnosis not present

## 2023-03-20 DIAGNOSIS — S90111A Contusion of right great toe without damage to nail, initial encounter: Secondary | ICD-10-CM | POA: Diagnosis not present

## 2023-03-20 DIAGNOSIS — M21622 Bunionette of left foot: Secondary | ICD-10-CM | POA: Diagnosis not present

## 2023-03-20 DIAGNOSIS — S90112A Contusion of left great toe without damage to nail, initial encounter: Secondary | ICD-10-CM | POA: Diagnosis not present

## 2023-05-18 ENCOUNTER — Encounter: Payer: Self-pay | Admitting: Family

## 2023-07-19 ENCOUNTER — Other Ambulatory Visit: Payer: Self-pay | Admitting: Family

## 2023-07-19 DIAGNOSIS — N920 Excessive and frequent menstruation with regular cycle: Secondary | ICD-10-CM

## 2023-07-20 ENCOUNTER — Other Ambulatory Visit: Payer: Self-pay | Admitting: Family

## 2023-07-20 DIAGNOSIS — Z3040 Encounter for surveillance of contraceptives, unspecified: Secondary | ICD-10-CM

## 2023-09-18 ENCOUNTER — Encounter: Payer: Self-pay | Admitting: Family

## 2023-09-18 DIAGNOSIS — Z3040 Encounter for surveillance of contraceptives, unspecified: Secondary | ICD-10-CM

## 2023-10-13 ENCOUNTER — Other Ambulatory Visit: Payer: Self-pay | Admitting: Family

## 2023-10-13 DIAGNOSIS — Z3040 Encounter for surveillance of contraceptives, unspecified: Secondary | ICD-10-CM

## 2023-10-18 ENCOUNTER — Other Ambulatory Visit: Payer: Self-pay | Admitting: Family

## 2023-10-18 DIAGNOSIS — N920 Excessive and frequent menstruation with regular cycle: Secondary | ICD-10-CM

## 2023-10-18 NOTE — Telephone Encounter (Signed)
 Called  pt and schedule a appt

## 2023-10-19 MED ORDER — LEVONORGEST-ETH ESTRAD 91-DAY 0.15-0.03 MG PO TABS: 1.0000 | ORAL_TABLET | Freq: Every day | ORAL | 1 refills | Status: AC

## 2023-10-19 NOTE — Addendum Note (Signed)
 Addended by: CORWIN ANTU on: 10/19/2023 06:59 AM   Modules accepted: Orders

## 2023-11-19 ENCOUNTER — Ambulatory Visit: Payer: Self-pay | Admitting: Family

## 2023-12-06 DIAGNOSIS — R0989 Other specified symptoms and signs involving the circulatory and respiratory systems: Secondary | ICD-10-CM | POA: Diagnosis not present

## 2023-12-06 DIAGNOSIS — J069 Acute upper respiratory infection, unspecified: Secondary | ICD-10-CM | POA: Diagnosis not present

## 2023-12-09 DIAGNOSIS — R0981 Nasal congestion: Secondary | ICD-10-CM | POA: Diagnosis not present

## 2023-12-09 DIAGNOSIS — J029 Acute pharyngitis, unspecified: Secondary | ICD-10-CM | POA: Diagnosis not present

## 2023-12-09 DIAGNOSIS — J069 Acute upper respiratory infection, unspecified: Secondary | ICD-10-CM | POA: Diagnosis not present

## 2024-03-13 ENCOUNTER — Other Ambulatory Visit: Payer: Self-pay | Admitting: Family

## 2024-03-13 DIAGNOSIS — N920 Excessive and frequent menstruation with regular cycle: Secondary | ICD-10-CM

## 2024-03-15 ENCOUNTER — Other Ambulatory Visit: Payer: Self-pay | Admitting: Family

## 2024-03-15 DIAGNOSIS — N920 Excessive and frequent menstruation with regular cycle: Secondary | ICD-10-CM
